# Patient Record
Sex: Female | Born: 1995 | Race: White | Hispanic: No | Marital: Married
Health system: Southern US, Community
[De-identification: ages and names within clinical notes are randomized; demographics above are authoritative.]

---

## 2019-06-06 ENCOUNTER — Other Ambulatory Visit: Payer: Self-pay

## 2019-06-06 ENCOUNTER — Emergency Department (HOSPITAL_COMMUNITY): Payer: BC Managed Care – PPO

## 2019-06-06 ENCOUNTER — Emergency Department (HOSPITAL_COMMUNITY)
Admission: EM | Admit: 2019-06-06 | Discharge: 2019-06-06 | Disposition: A | Discharge status: Home / Self Care | Payer: BC Managed Care – PPO | Attending: Emergency Medicine | Admitting: Emergency Medicine

## 2019-06-06 ENCOUNTER — Encounter (HOSPITAL_COMMUNITY): Payer: Self-pay | Admitting: Emergency Medicine

## 2019-06-06 DIAGNOSIS — Z79899 Other long term (current) drug therapy: Secondary | ICD-10-CM | POA: Insufficient documentation

## 2019-06-06 DIAGNOSIS — O200 Threatened abortion: Secondary | ICD-10-CM | POA: Insufficient documentation

## 2019-06-06 DIAGNOSIS — Z3A12 12 weeks gestation of pregnancy: Secondary | ICD-10-CM | POA: Insufficient documentation

## 2019-06-06 DIAGNOSIS — Z2913 Encounter for prophylactic Rho(D) immune globulin: Secondary | ICD-10-CM | POA: Insufficient documentation

## 2019-06-06 DIAGNOSIS — O209 Hemorrhage in early pregnancy, unspecified: Secondary | ICD-10-CM | POA: Diagnosis present

## 2019-06-06 LAB — CBC WITH DIFFERENTIAL/PLATELET
Abs Immature Granulocytes: 0.08 10*3/uL — ABNORMAL HIGH (ref 0.00–0.07)
Basophils Absolute: 0 10*3/uL (ref 0.0–0.1)
Basophils Relative: 0 %
Eosinophils Absolute: 0.1 10*3/uL (ref 0.0–0.5)
Eosinophils Relative: 1 %
HCT: 41.1 % (ref 36.0–46.0)
Hemoglobin: 13.7 g/dL (ref 12.0–15.0)
Immature Granulocytes: 1 %
Lymphocytes Relative: 25 %
Lymphs Abs: 2 10*3/uL (ref 0.7–4.0)
MCH: 27.7 pg (ref 26.0–34.0)
MCHC: 33.3 g/dL (ref 30.0–36.0)
MCV: 83.2 fL (ref 80.0–100.0)
Monocytes Absolute: 0.7 10*3/uL (ref 0.1–1.0)
Monocytes Relative: 8 %
Neutro Abs: 5.3 10*3/uL (ref 1.7–7.7)
Neutrophils Relative %: 65 %
Platelets: 231 10*3/uL (ref 150–400)
RBC: 4.94 MIL/uL (ref 3.87–5.11)
RDW: 13.3 % (ref 11.5–15.5)
WBC: 8.1 10*3/uL (ref 4.0–10.5)
nRBC: 0 % (ref 0.0–0.2)

## 2019-06-06 LAB — BASIC METABOLIC PANEL
Anion gap: 10 (ref 5–15)
BUN: 7 mg/dL (ref 6–20)
CO2: 22 mmol/L (ref 22–32)
Calcium: 9 mg/dL (ref 8.9–10.3)
Chloride: 104 mmol/L (ref 98–111)
Creatinine, Ser: 0.58 mg/dL (ref 0.44–1.00)
GFR calc Af Amer: >60 mL/min (ref >60)
GFR calc non Af Amer: >60 mL/min (ref >60)
Glucose, Bld: 87 mg/dL (ref 70–99)
Potassium: 3.8 mmol/L (ref 3.5–5.1)
Sodium: 136 mmol/L (ref 135–145)

## 2019-06-06 LAB — ABO/RH: ABO/RH(D): B NEG

## 2019-06-06 LAB — URINALYSIS, ROUTINE W REFLEX MICROSCOPIC
Bilirubin Urine: NEGATIVE
Glucose, UA: NEGATIVE mg/dL
Hgb urine dipstick: NEGATIVE
Ketones, ur: NEGATIVE mg/dL
Leukocytes,Ua: NEGATIVE
Nitrite: NEGATIVE
Protein, ur: NEGATIVE mg/dL
Specific Gravity, Urine: 1.016 (ref 1.005–1.030)
pH: 7 (ref 5.0–8.0)

## 2019-06-06 LAB — WET PREP, GENITAL
Clue Cells Wet Prep HPF POC: NONE SEEN
Sperm: NONE SEEN
Trich, Wet Prep: NONE SEEN
Yeast Wet Prep HPF POC: NONE SEEN

## 2019-06-06 LAB — HCG, QUANTITATIVE, PREGNANCY: hCG, Beta Chain, Quant, S: 83864 m[IU]/mL — ABNORMAL HIGH (ref <5)

## 2019-06-06 MED ORDER — RHO D IMMUNE GLOBULIN 1500 UNIT/2ML IJ SOSY
300.0000 ug | PREFILLED_SYRINGE | Freq: Once | INTRAMUSCULAR | Status: AC
  Administered 2019-06-06: 300 ug via INTRAVENOUS
  Filled 2019-06-06: qty 2

## 2019-06-06 MED ORDER — ACETAMINOPHEN 500 MG PO TABS
1000.0000 mg | ORAL_TABLET | Freq: Once | ORAL | Status: AC
  Administered 2019-06-06: 09:00:00 1000 mg via ORAL
  Filled 2019-06-06: qty 2

## 2019-06-06 NOTE — ED Notes (Signed)
Mother at bedside.PA aware.

## 2019-06-06 NOTE — ED Triage Notes (Signed)
C/C vaginal bleeding since this morning, dark brown, small amounts, no pads. Patient is high-risk pregnancy, [redacted] weeks pregnant on Friday. Started mild cramping yesterday, she lifted a baby swing.

## 2019-06-06 NOTE — Discharge Instructions (Signed)
Please call your OB-GYN to schedule an appointment for follow up next week  Please return to the emergency department immediately for any new or worsening symptoms including fevers, increased pain, heavy bleeding, or any new or worsening concerns.

## 2019-06-06 NOTE — ED Provider Notes (Signed)
[facility]-EMERGENCY DEPT Provider Note   CSN: [id number] Arrival date & time: 06/06/19  3335     History Chief Complaint  Patient presents with  . Vaginal Bleeding    Roberta Hendricks is a 24 y.o. female.  HPI   24 year old female, G3 P0 A2, that is currently about [redacted] weeks pregnant presents today for evaluation of lower abdominal pain and vaginal bleeding.  States that she started having abdominal cramping yesterday after lifting heavy object.  Cramping is progressively worsened since onset.  She has also had dark brown vaginal spotting that is minimal.  She notes that she has also had some white discharge.  She denies any dysuria, frequency or urgency.  Denies any nausea, vomiting, diarrhea.  She has been somewhat constipated.  Denies any fevers.  She recently moved to the area but has established with OB/GYN and sees Dr. Lavina Hamman at [facility].  States she has had an ultrasound with confirmed IUP.  She does have history of ectopic pregnancy.  History reviewed. No pertinent past medical history.  There are no problems to display for this patient.   History reviewed. No pertinent surgical history.   OB History    Gravida  1   Para      Term      Preterm      AB      Living        SAB      TAB      Ectopic      Multiple      Live Births              History reviewed. No pertinent family history.  Social History   Tobacco Use  . Smoking status: Not on file  Substance Use Topics  . Alcohol use: Not on file  . Drug use: Not on file    Home Medications Prior to Admission medications   Medication Sig Start Date End Date Taking? Authorizing Provider  acetaminophen (TYLENOL) 500 MG tablet Take 1,000 mg by mouth every 6 (six) hours as needed for mild pain or headache.   Yes [provider]  cyanocobalamin (,VITAMIN B-12,) 1000 MCG/ML injection Inject 1,000 mcg into the muscle once a week. 04/27/19  Yes [provider]  cyclobenzaprine (FLEXERIL) 10 MG tablet Take 10 mg by mouth 3 (three) times daily as needed for muscle spasms.  05/15/19  Yes [provider]  diphenhydrAMINE (BENADRYL) 25 mg capsule Take 25 mg by mouth every 6 (six) hours as needed for allergies.   Yes [provider]  FOLIC ACID PO Take 1 tablet by mouth daily.   Yes [provider]  OVER THE COUNTER MEDICATION Take 4 capsules by mouth daily. myo & D-Chiro Inositol   Yes [provider]  Prenatal Vit-Fe Fumarate-FA (PRENATAL PO) Take 1 tablet by mouth daily.   Yes [provider]  VITAMIN D PO Take 1 tablet by mouth daily.   Yes [provider]    Allergies    Patient has no known allergies.  Review of Systems   Review of Systems  Constitutional: Negative for fever.  HENT: Negative for ear pain and sore throat.   Eyes: Negative for visual disturbance.  Respiratory: Negative for cough and shortness of breath.   Cardiovascular: Negative for chest pain.  Gastrointestinal: Negative for abdominal pain, constipation, diarrhea, nausea and vomiting.  Genitourinary: Positive for pelvic pain, vaginal bleeding and vaginal discharge. Negative for dysuria, frequency  and hematuria.  Musculoskeletal: Negative for back pain.  Skin: Negative for rash.  Neurological: Negative for headaches.  All other systems reviewed and are negative.   Physical Exam Updated Vital Signs BP 135/89   Pulse 85   Temp 98.1 F (36.7 C) (Oral)   Resp 16   SpO2 96%   Physical Exam Vitals and nursing note reviewed.  Constitutional:      General: She is not in acute distress.    Appearance: She is well-developed.  HENT:     Head: Normocephalic and atraumatic.  Eyes:     Conjunctiva/sclera: Conjunctivae normal.  Cardiovascular:     Rate and Rhythm: Normal rate and regular rhythm.     Pulses: Normal pulses.     Heart sounds: Normal heart sounds. No murmur.  Pulmonary:     Effort: Pulmonary  effort is normal. No respiratory distress.     Breath sounds: Normal breath sounds. No wheezing, rhonchi or rales.  Abdominal:     General: Bowel sounds are normal.     Palpations: Abdomen is soft.     Tenderness: There is abdominal tenderness (RLQ, right pelvic area ). There is no guarding or rebound.  Genitourinary:    Comments: Exam performed by Karrie Meres,  exam chaperoned Date: 06/06/2019 Pelvic exam: normal external genitalia without evidence of trauma. VULVA: normal appearing vulva with no masses, tenderness or lesion. VAGINA: normal appearing vagina with normal color and discharge, no lesions.  CERVIX: normal appearing cervix without lesions, cervical motion tenderness absent, cervical os closed with out purulent discharge; vaginal discharge is absent, Wet prep and DNA probe for chlamydia and GC obtained. There is a small amount of bleeding at the cervical os. ADNEXA: normal adnexa in size and no masses, and there is some mild right adnexal ttp UTERUS: uterus is normal size, shape, consistency and is mildly TTP.  Musculoskeletal:     Cervical back: Neck supple.  Skin:    General: Skin is warm and dry.  Neurological:     Mental Status: She is alert.     ED Results / Procedures / Treatments   Labs (all labs ordered are listed, but only abnormal results are displayed) Labs Reviewed  WET PREP, GENITAL - Abnormal; Notable for the following components:      Result Value   WBC, Wet Prep HPF POC MANY (*)    All other components within normal limits  CBC WITH DIFFERENTIAL/PLATELET - Abnormal; Notable for the following components:   Abs Immature Granulocytes 0.08 (*)    All other components within normal limits  HCG, QUANTITATIVE, PREGNANCY - Abnormal; Notable for the following components:   hCG, Beta Chain, [place] Longest 37,366 (*)    All other components within normal limits  BASIC METABOLIC PANEL  URINALYSIS, ROUTINE W REFLEX MICROSCOPIC  ABO/RH  RH IG WORKUP (INCLUDES  ABO/RH)  GC/CHLAMYDIA PROBE AMP ([place]) NOT AT [facility]    EKG None  Radiology US OB Comp < 14 Wks  Result Date: 06/06/2019 CLINICAL DATA:  Brown spotting. Estimated gestational age of [redacted] weeks, 2 days by LMP. EXAM: OBSTETRIC <14 WK ULTRASOUND TECHNIQUE: Transabdominal ultrasound was performed for evaluation of the gestation as well as the maternal uterus and adnexal regions. COMPARISON:  None. FINDINGS: Intrauterine gestational sac: Single Yolk sac:  Visualized. Embryo:  Visualized. Cardiac Activity: Visualized. Heart Rate: 173 bpm CRL:   5.7 cm   12 w 2 d  [place] EDC: 12/17/2019 Subchorionic hemorrhage:  None visualized. Maternal uterus/adnexae: Unremarkable. IMPRESSION: 1. Single live intrauterine pregnancy with estimated gestational age of [redacted] weeks, 2 days. No acute abnormality. Electronically Signed   By: Titus Dubin M.D.   On: 06/06/2019 10:21    Procedures Procedures (including critical care time)  Medications Ordered in ED Medications  acetaminophen (TYLENOL) tablet 1,000 mg (1,000 mg Oral Given 06/06/19 0911)  rho (d) immune globulin (RHIG/RHOPHYLAC) injection 300 mcg (300 mcg Intravenous Given 06/06/19 1114)    ED Course  I have reviewed the triage vital signs and the nursing notes.  Pertinent labs & imaging results that were available during my care of the patient were reviewed by me and considered in my medical decision making (see chart for details).    MDM Rules/Calculators/A&P                      24 y/o F presenting to the ED today for eval of vaginal bleeding and pelvic cramping in pregnancy.  Will get labs, [place], and give tylenol for pain.   Reviewed/interpreted labs CBC w/o leukocytosis, no anemia BMP nonacute Abo/Rh neg   - Rhogam given  UA w/o evidence of hematuria or UTI Wet prep with wbc, no clue cells to suggest bv Gc/chlamydia pending  Pelvic US  1. Single live intrauterine pregnancy with estimated gestational age of [redacted] weeks, 2 days.  No acute abnormality.  On reassessment pt states she is still having some cramping but pain has improved somewhat with tylenol. She has not had any significant bleeding the the ED. Discussed w/u and likely diagnosis of threatened miscarriage. She will need to f/u with her ob-gyn for reassessment. Advised on specific return precautions. She voices understanding of the plan and reasons to return. All questions answered, pt stable for discharge.   Final Clinical Impression(s) / ED Diagnoses Final diagnoses:  Threatened miscarriage    Rx / DC Orders ED Discharge Orders    None       Bishop Dublin 06/06/19 1232    Lacretia Leigh, MD 06/08/19 1110

## 2019-06-07 LAB — RH IG WORKUP (INCLUDES ABO/RH)
ABO/RH(D): B NEG
Antibody Screen: NEGATIVE
Gestational Age(Wks): 12
Unit division: 0
Unit tag comment: 12

## 2019-06-07 LAB — GC/CHLAMYDIA PROBE AMP (~~LOC~~) NOT AT ARMC
Chlamydia: NEGATIVE
Comment: NEGATIVE
Comment: NORMAL
Neisseria Gonorrhea: NEGATIVE

## 2020-11-26 IMAGING — US US OB COMP LESS 14 WK
1 series · 14 of 28 positions shown · non-contrast
Comparison: None.

CLINICAL DATA: Brown spotting. Estimated gestational age of 12
weeks, 2 days by LMP.

EXAM:
OBSTETRIC <14 WK ULTRASOUND
TECHNIQUE: Transabdominal ultrasound was performed for evaluation of the
gestation as well as the maternal uterus and adnexal regions.

[Series 1: us ob comp less 14 wk · 14 of 49 slices shown]
[im 2/49]
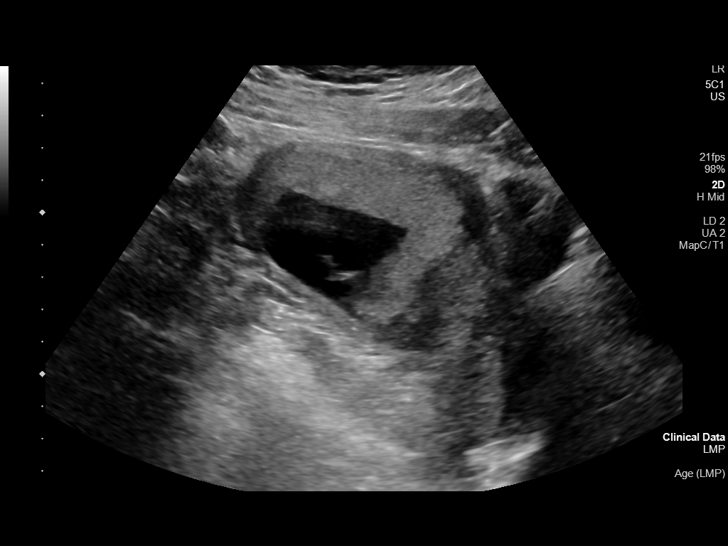
[im 6/49]
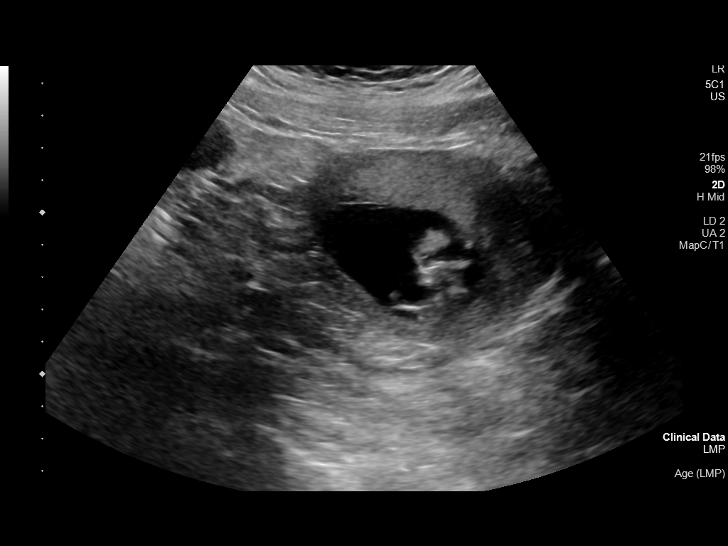
[im 9/49]
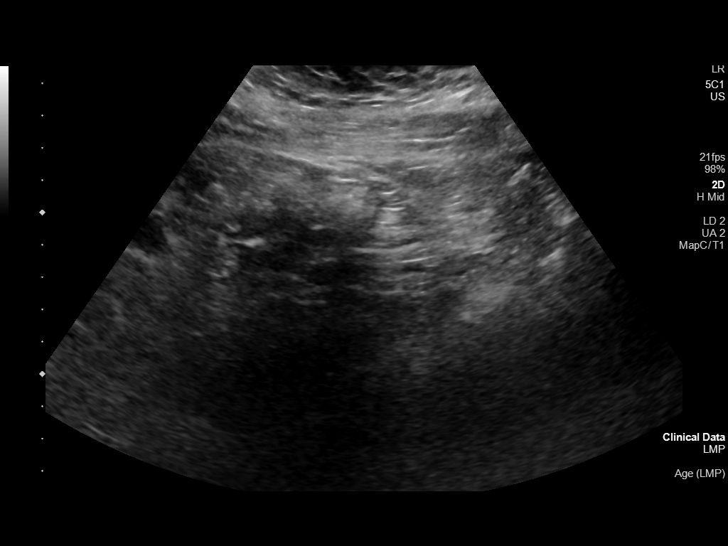
[im 13/49]
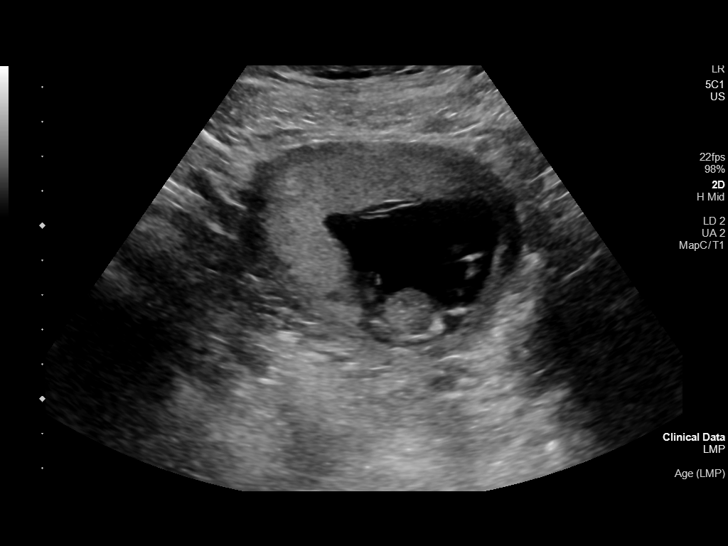
[im 17/49]
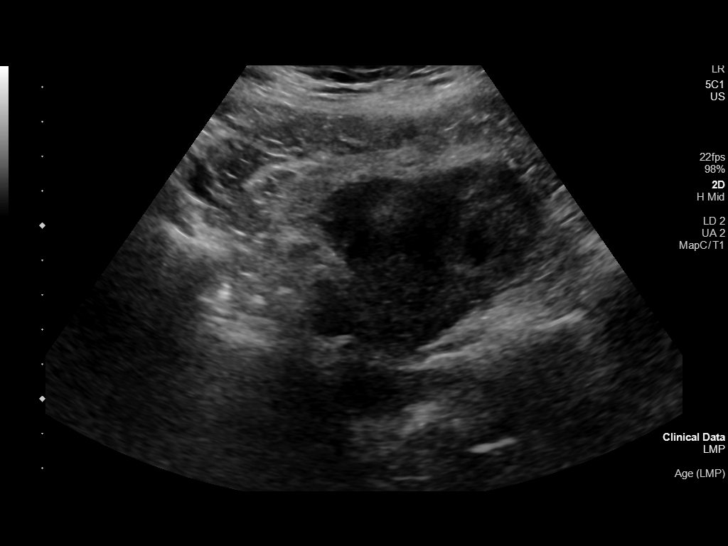
[im 20/49]
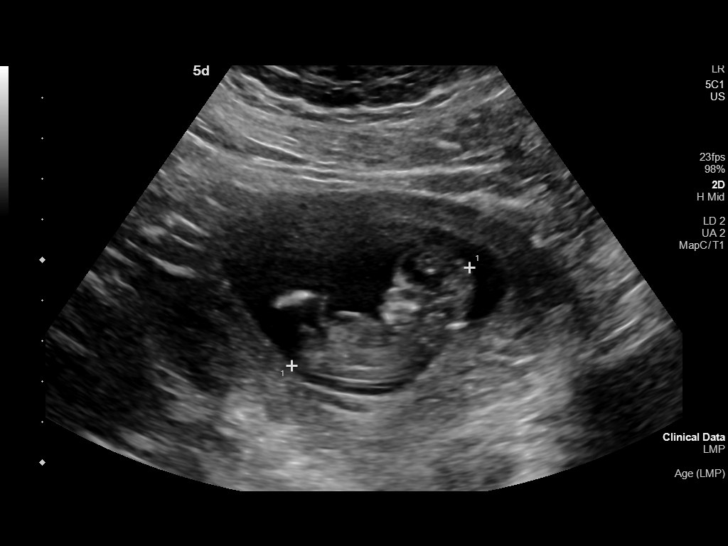
[im 24/49]
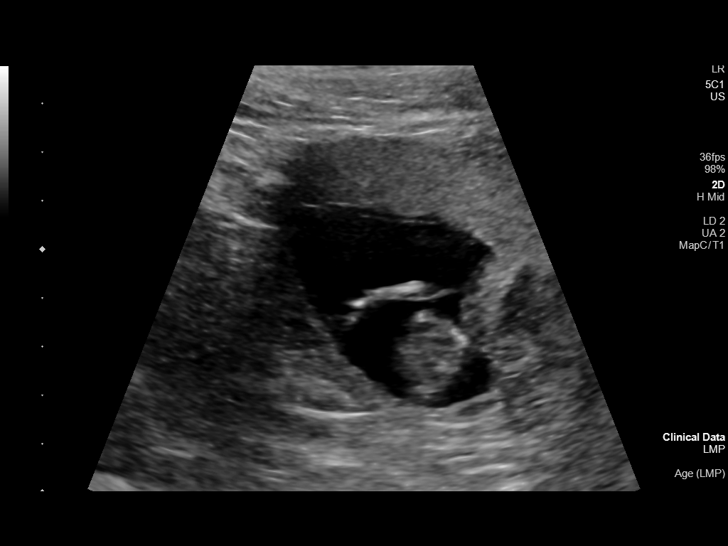
[im 27/49]
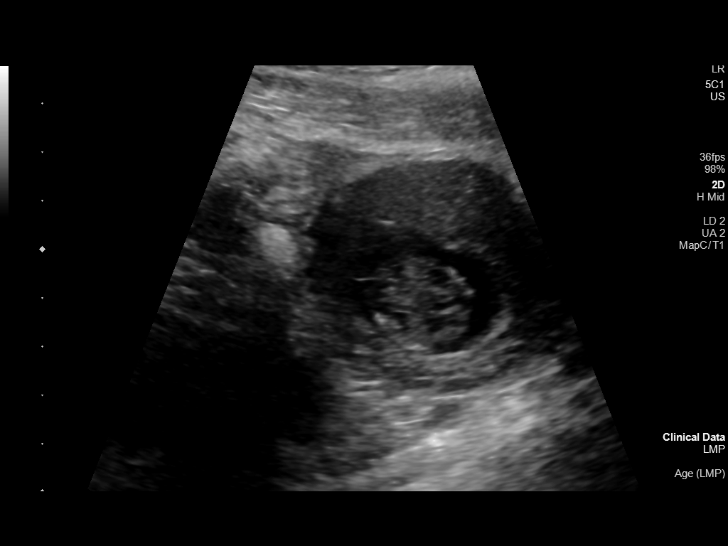
[im 31/49]
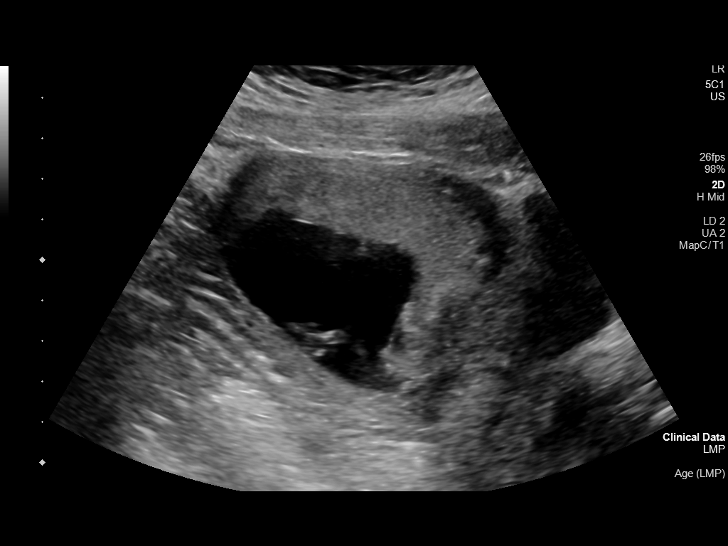
[im 34/49]
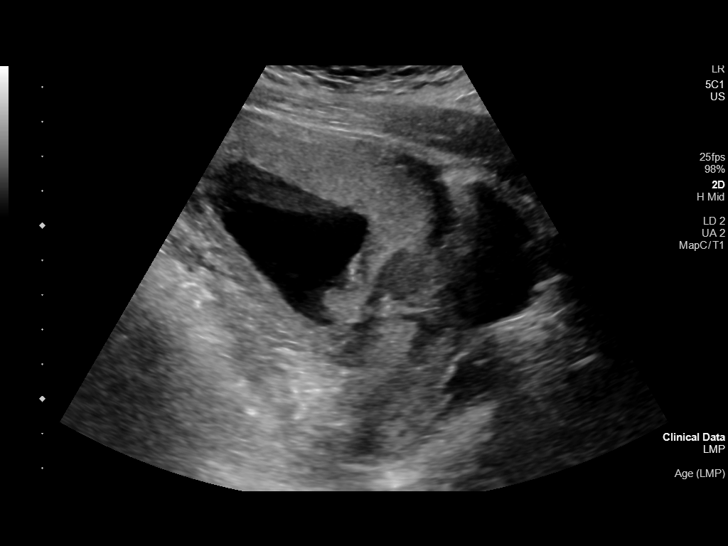
[im 38/49]
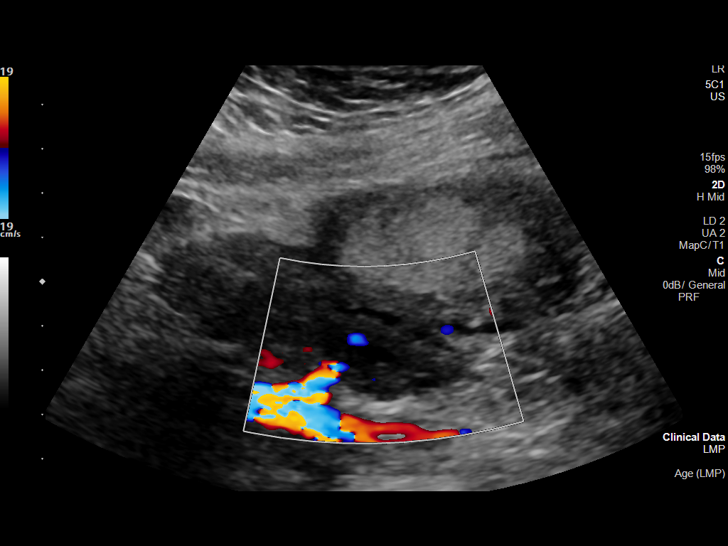
[im 41/49]
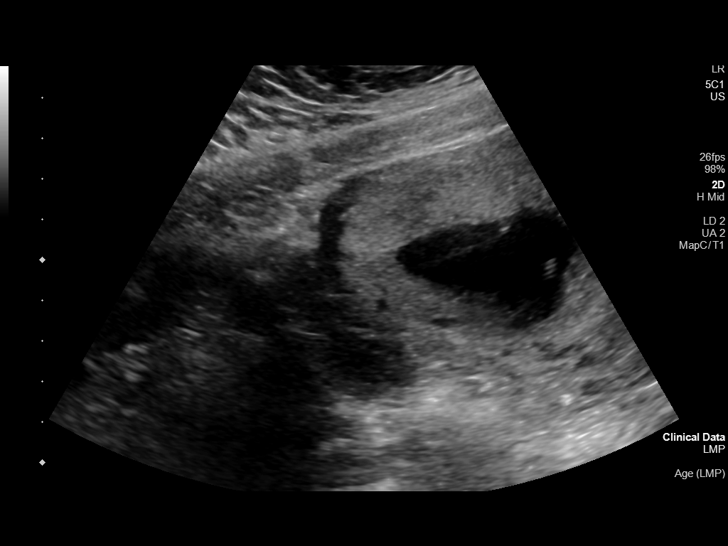
[im 45/49]
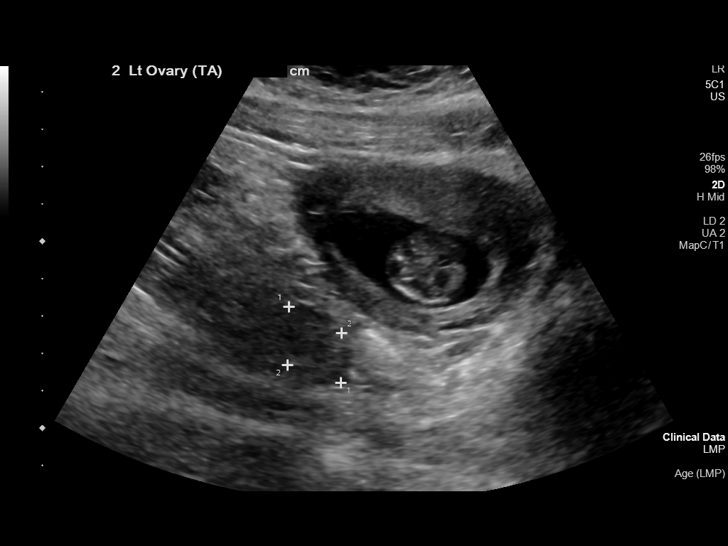
[im 49/49]
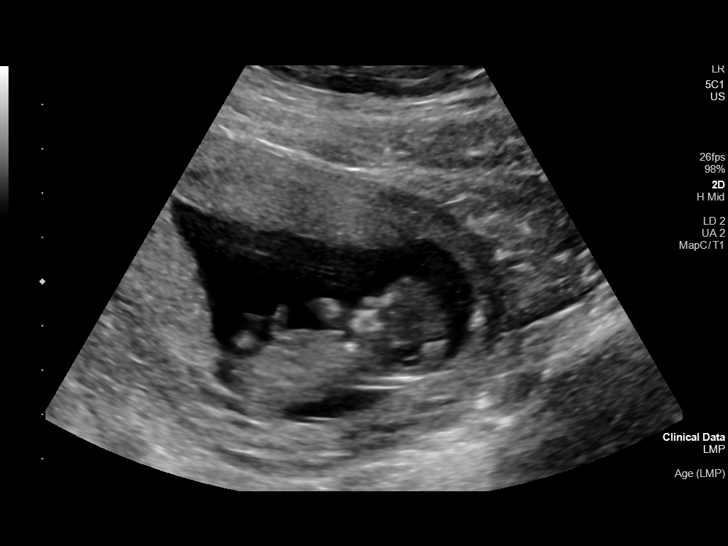

[14 of 28 positions shown; findings below may reference images not displayed]

FINDINGS: Intrauterine gestational sac: Single

Yolk sac:  Visualized.

Embryo:  Visualized.

Cardiac Activity: Visualized.

Heart Rate: 173 bpm

CRL:   5.7 cm   12 w 2 d                  US EDC: 12/17/2019

Subchorionic hemorrhage:  None visualized.

Maternal uterus/adnexae: Unremarkable.
IMPRESSION: 1. Single live intrauterine pregnancy with estimated gestational age
of 12 weeks, 2 days. No acute abnormality.
